# Patient Record
Sex: Female | Born: 2005 | Race: White | Hispanic: No | Marital: Single | Smoking: Never smoker
Health system: Southern US, Community
[De-identification: ages and names within clinical notes are randomized; demographics above are authoritative.]

## PROBLEM LIST (undated history)

## (undated) DIAGNOSIS — F32A Depression, unspecified: Secondary | ICD-10-CM

## (undated) DIAGNOSIS — F319 Bipolar disorder, unspecified: Secondary | ICD-10-CM

## (undated) DIAGNOSIS — F329 Major depressive disorder, single episode, unspecified: Secondary | ICD-10-CM

## (undated) DIAGNOSIS — F419 Anxiety disorder, unspecified: Secondary | ICD-10-CM

## (undated) HISTORY — PX: NO PAST SURGERIES: SHX2092

## (undated) HISTORY — PX: WISDOM TOOTH EXTRACTION: SHX21

---
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
## 2023-01-02 ENCOUNTER — Other Ambulatory Visit: Payer: Self-pay

## 2023-01-02 ENCOUNTER — Ambulatory Visit (INDEPENDENT_AMBULATORY_CARE_PROVIDER_SITE_OTHER): Payer: BC Managed Care – PPO | Admitting: *Deleted

## 2023-01-02 VITALS — BP 93/68 | HR 95 | Ht 63.0 in | Wt 138.5 lb

## 2023-01-02 DIAGNOSIS — Z3042 Encounter for surveillance of injectable contraceptive: Secondary | ICD-10-CM

## 2023-01-02 MED ORDER — MEDROXYPROGESTERONE ACETATE 150 MG/ML IM SUSP
150.0000 mg | Freq: Once | INTRAMUSCULAR | Status: AC
  Administered 2023-01-02: 150 mg via INTRAMUSCULAR

## 2023-01-02 NOTE — Progress Notes (Signed)
Depo Provera 150 mg IM administered as scheduled. Pt tolerated well. Next dose due 8/21-9/4. Per chart review, pt needs follow up appt w/Dr. Alvester Morin for discussion of hormone management. Pt will schedule @ check-out.

## 2023-01-15 ENCOUNTER — Ambulatory Visit: Payer: Self-pay | Admitting: Family Medicine

## 2023-03-20 ENCOUNTER — Ambulatory Visit: Payer: BC Managed Care – PPO

## 2023-04-05 ENCOUNTER — Ambulatory Visit: Payer: BC Managed Care – PPO

## 2023-04-08 ENCOUNTER — Ambulatory Visit: Payer: BC Managed Care – PPO

## 2023-04-19 ENCOUNTER — Other Ambulatory Visit: Payer: Self-pay

## 2023-04-19 ENCOUNTER — Ambulatory Visit (INDEPENDENT_AMBULATORY_CARE_PROVIDER_SITE_OTHER): Payer: BC Managed Care – PPO | Admitting: *Deleted

## 2023-04-19 VITALS — BP 106/73 | HR 91 | Ht 64.0 in | Wt 147.0 lb

## 2023-04-19 DIAGNOSIS — Z3042 Encounter for surveillance of injectable contraceptive: Secondary | ICD-10-CM

## 2023-04-19 LAB — POCT PREGNANCY, URINE: Preg Test, Ur: NEGATIVE

## 2023-04-19 MED ORDER — MEDROXYPROGESTERONE ACETATE 150 MG/ML IM SUSY
150.0000 mg | PREFILLED_SYRINGE | Freq: Once | INTRAMUSCULAR | Status: AC
  Administered 2023-04-19: 150 mg via INTRAMUSCULAR

## 2023-04-19 NOTE — Progress Notes (Signed)
Here for depo-provera injection. Last injection 01/02/23 which was 15weeks 2 days ago. Last provider exam 10/11/22.  Per protocol I asked when was last intercourse and she reports 1 week ago but used condoms. UPT today negative. Injection given without complaint. Sent to desk to schedule next injection. Nancy Fetter

## 2023-05-29 ENCOUNTER — Emergency Department (HOSPITAL_COMMUNITY): Payer: BC Managed Care – PPO

## 2023-05-29 ENCOUNTER — Other Ambulatory Visit: Payer: Self-pay

## 2023-05-29 ENCOUNTER — Emergency Department (HOSPITAL_COMMUNITY)
Admission: EM | Admit: 2023-05-29 | Discharge: 2023-05-30 | Disposition: A | Discharge status: Home / Self Care | Payer: BC Managed Care – PPO | Attending: Emergency Medicine | Admitting: Emergency Medicine

## 2023-05-29 ENCOUNTER — Encounter (HOSPITAL_COMMUNITY): Payer: Self-pay | Admitting: Emergency Medicine

## 2023-05-29 DIAGNOSIS — R195 Other fecal abnormalities: Secondary | ICD-10-CM | POA: Diagnosis not present

## 2023-05-29 DIAGNOSIS — R109 Unspecified abdominal pain: Secondary | ICD-10-CM | POA: Diagnosis not present

## 2023-05-29 DIAGNOSIS — R197 Diarrhea, unspecified: Secondary | ICD-10-CM | POA: Insufficient documentation

## 2023-05-29 LAB — CBC WITH DIFFERENTIAL/PLATELET
Abs Immature Granulocytes: 0.02 10*3/uL (ref 0.00–0.07)
Basophils Absolute: 0 10*3/uL (ref 0.0–0.1)
Basophils Relative: 1 %
Eosinophils Absolute: 0.1 10*3/uL (ref 0.0–0.5)
Eosinophils Relative: 1 %
HCT: 39.1 % (ref 36.0–46.0)
Hemoglobin: 12.8 g/dL (ref 12.0–15.0)
Immature Granulocytes: 0 %
Lymphocytes Relative: 35 %
Lymphs Abs: 2.6 10*3/uL (ref 0.7–4.0)
MCH: 29.2 pg (ref 26.0–34.0)
MCHC: 32.7 g/dL (ref 30.0–36.0)
MCV: 89.1 fL (ref 80.0–100.0)
Monocytes Absolute: 0.7 10*3/uL (ref 0.1–1.0)
Monocytes Relative: 10 %
Neutro Abs: 4 10*3/uL (ref 1.7–7.7)
Neutrophils Relative %: 53 %
Platelets: 408 10*3/uL — ABNORMAL HIGH (ref 150–400)
RBC: 4.39 MIL/uL (ref 3.87–5.11)
RDW: 12.2 % (ref 11.5–15.5)
WBC: 7.5 10*3/uL (ref 4.0–10.5)
nRBC: 0 % (ref 0.0–0.2)

## 2023-05-29 LAB — I-STAT CHEM 8, ED
BUN: 11 mg/dL (ref 6–20)
Calcium, Ion: 1.24 mmol/L (ref 1.15–1.40)
Chloride: 106 mmol/L (ref 98–111)
Creatinine, Ser: 0.6 mg/dL (ref 0.44–1.00)
Glucose, Bld: 94 mg/dL (ref 70–99)
HCT: 38 % (ref 36.0–46.0)
Hemoglobin: 12.9 g/dL (ref 12.0–15.0)
Potassium: 3.7 mmol/L (ref 3.5–5.1)
Sodium: 139 mmol/L (ref 135–145)
TCO2: 21 mmol/L — ABNORMAL LOW (ref 22–32)

## 2023-05-29 LAB — COMPREHENSIVE METABOLIC PANEL
ALT: 15 U/L (ref 0–44)
AST: 20 U/L (ref 15–41)
Albumin: 4.4 g/dL (ref 3.5–5.0)
Alkaline Phosphatase: 95 U/L (ref 38–126)
Anion gap: 9 (ref 5–15)
BUN: 12 mg/dL (ref 6–20)
CO2: 21 mmol/L — ABNORMAL LOW (ref 22–32)
Calcium: 9.4 mg/dL (ref 8.9–10.3)
Chloride: 107 mmol/L (ref 98–111)
Creatinine, Ser: 0.52 mg/dL (ref 0.44–1.00)
GFR, Estimated: >60 mL/min (ref >60)
Glucose, Bld: 94 mg/dL (ref 70–99)
Potassium: 3.6 mmol/L (ref 3.5–5.1)
Sodium: 137 mmol/L (ref 135–145)
Total Bilirubin: 0.6 mg/dL (ref 0.3–1.2)
Total Protein: 7.9 g/dL (ref 6.5–8.1)

## 2023-05-29 LAB — LIPASE, BLOOD: Lipase: 43 U/L (ref 11–51)

## 2023-05-29 LAB — HCG, QUANTITATIVE, PREGNANCY: hCG, Beta Chain, Quant, S: <1 m[IU]/mL (ref <5)

## 2023-05-29 MED ORDER — IOHEXOL 300 MG/ML  SOLN
100.0000 mL | Freq: Once | INTRAMUSCULAR | Status: AC | PRN
  Administered 2023-05-29: 100 mL via INTRAVENOUS

## 2023-05-29 NOTE — ED Triage Notes (Signed)
Patient arrives ambulatory by POV c/o pink stools x 2 days. Yesterday began vomiting blood. Today having right sided abdominal pain.

## 2023-05-29 NOTE — ED Provider Triage Note (Signed)
Emergency Medicine Provider Triage Evaluation Note  Emily Kirk , a 17 y.o. adult  was evaluated in triage.  Pt complains of hematochezia/hematemesis, abdominal pain.  Review of Systems  Positive:  Negative:   Physical Exam  BP 117/65 (BP Location: Left Arm)   Pulse 90   Temp 98.3 F (36.8 C) (Oral)   Resp (!) 161   Ht 5\' 4"  (1.626 m)   Wt 65.8 kg   SpO2 99%   BMI 24.89 kg/m  Gen:   Awake, no distress   Resp:  Normal effort  MSK:   Moves extremities without difficulty  Other:    Medical Decision Making  Medically screening exam initiated at 7:12 PM.  Appropriate orders placed.  Emily Kirk was informed that the remainder of the evaluation will be completed by another provider, this initial triage assessment does not replace that evaluation, and the importance of remaining in the ED until their evaluation is complete.  Pink stools/diarrhea x2 days. Hematemesis started yesterday. Intermittent RLQ abdominal pain since today. Denies ibuprofen/blood thinners. Denies abdominal trauma.   Denies fever, chest pain, dyspnea, cough, nausea, vomiting, diarrhea, dysuria, hematuria.    Emily Kirk, [place] 05/29/23 [phone]

## 2023-05-30 MED ORDER — DICYCLOMINE HCL 20 MG PO TABS: 20.0000 mg | ORAL_TABLET | Freq: Two times a day (BID) | ORAL | 0 refills | Status: AC

## 2023-05-30 MED ORDER — HYDROCORTISONE ACETATE 25 MG RE SUPP: 25.0000 mg | Freq: Two times a day (BID) | RECTAL | 0 refills | Status: AC

## 2023-05-30 MED ORDER — FAMOTIDINE 20 MG PO TABS: 20.0000 mg | ORAL_TABLET | Freq: Two times a day (BID) | ORAL | 0 refills | Status: AC

## 2023-05-30 NOTE — Discharge Instructions (Addendum)
Take the prescribed medication as directed. We discussed stool sample here, kit given.  Can call your doctor to have this done as an outpatient if you like. Follow-up with GI if symptoms persist-- call to make appt. Return to the ED for new or worsening symptoms.

## 2023-05-30 NOTE — ED Provider Notes (Signed)
[place] EMERGENCY DEPARTMENT AT [facility] Provider Note   CSN: [id number] Arrival date & time: 05/29/23  1824     History  Chief Complaint  Patient presents with   Rectal Bleeding   Hematemesis    Emily Kirk is a 17 y.o. adult.  The history is provided by the patient and medical records.  Rectal Bleeding  17 year old female with history of anxiety, bipolar disorder, depression, gender dysphoria, presenting to the ED for diarrhea and blood in stool.  Began having diarrhea yesterday, reports innumerable episodes.  Stool has been loose/watery, was pink-tinged yesterday but progressed to more blood today.  She is not having any bleeding aside from with bowel movements.  Also reports she feels like she vomited up a little blood today.  She has not had any fever or chills.  No sick contacts with similar.  No recent travel or abnormal food intake.  No recent antibiotics use.  No underlying history of IBS, Crohn's, ulcerative colitis, etc.  Mother does have history of IBS.  Home Medications Prior to Admission medications   Medication Sig Start Date End Date Taking? Authorizing Provider  dicyclomine (BENTYL) 20 MG tablet Take 1 tablet (20 mg total) by mouth 2 (two) times daily. 05/30/23  Yes Garlon Hatchet, PA-C  famotidine (PEPCID) 20 MG tablet Take 1 tablet (20 mg total) by mouth 2 (two) times daily. 05/30/23  Yes Garlon Hatchet, PA-C  hydrocortisone (ANUSOL-HC) 25 MG suppository Place 1 suppository (25 mg total) rectally 2 (two) times daily. 05/30/23  Yes Garlon Hatchet, PA-C  ALPRAZolam Prudy Feeler) 1 MG tablet Take 1 mg by mouth daily as needed. 10/12/20   [provider]  amphetamine-dextroamphetamine (ADDERALL XR) 10 MG 24 hr capsule Take 10 mg by mouth every morning. 04/02/23   [provider]  amphetamine-dextroamphetamine (ADDERALL) 5 MG tablet Take 1 tablet by mouth daily. 03/29/23   [provider]  busPIRone (BUSPAR) 10 MG tablet Take 10  mg by mouth 2 (two) times daily. 10/21/21   [provider]  Elagolix Sodium (ORILISSA) 150 MG TABS Take 1 tablet (150 mg total) by mouth daily. 12/21/22   Federico Flake, MD  FLUoxetine (PROZAC) 20 MG capsule Take 60 mg by mouth every morning. 01/19/23   [provider]  ibuprofen (ADVIL) 600 MG tablet Take 1 tablet (600 mg total) by mouth every 6 (six) hours as needed. 12/17/22   Immordino, Jeannett Senior, FNP  ipratropium (ATROVENT) 0.03 % nasal spray Place 2 sprays into both nostrils every 12 (twelve) hours. 09/17/22   Radford Pax, NP  lurasidone (LATUDA) 40 MG TABS tablet Take 40 mg by mouth daily with breakfast. Patient not taking: Reported on 04/19/2023    [provider]  QUEtiapine (SEROQUEL) 50 MG tablet Take 50 mg by mouth at bedtime.    [provider]  rizatriptan (MAXALT) 5 MG tablet Take 1 tablet along with Ibuprofen 400mg  at onset of migraine. May repeat in 2 hours if needed Patient not taking: Reported on 10/08/2022 05/04/21   Elveria Rising, NP  SYMBICORT 80-4.5 MCG/ACT inhaler Inhale 2 puffs into the lungs as needed. 10/22/22   [provider]  clonazePAM (KLONOPIN) 0.5 MG tablet TAKE 1/2 TABLET TWICE DAILY AS NEEDED FOR ANXIETY Patient not taking: No sig reported 09/22/18 09/29/20  Darcel Smalling, MD  norethindrone-ethinyl estradiol 1/35 (ORTHO-NOVUM) tablet Take by mouth. 05/06/17 11/09/20  [provider]      Allergies    Penicillins and Lurasidone  Review of Systems   Review of Systems  Gastrointestinal:  Positive for blood in stool and hematochezia.  All other systems reviewed and are negative.   Physical Exam Updated Vital Signs BP 120/69 (BP Location: Left Arm)   Pulse (!) 107   Temp 97.9 F (36.6 C) (Oral)   Resp 18   Ht 5\' 4"  (1.626 m)   Wt 65.8 kg   SpO2 99%   BMI 24.89 kg/m   Physical Exam Vitals and nursing note reviewed.  Constitutional:      Appearance: He is well-developed.  HENT:     Head:  Normocephalic and atraumatic.  Eyes:     Conjunctiva/sclera: Conjunctivae normal.     Pupils: Pupils are equal, round, and reactive to light.  Cardiovascular:     Rate and Rhythm: Normal rate and regular rhythm.     Heart sounds: Normal heart sounds.  Pulmonary:     Effort: Pulmonary effort is normal. No respiratory distress.     Breath sounds: Normal breath sounds. No rhonchi.  Abdominal:     General: Bowel sounds are normal.     Palpations: Abdomen is soft.     Tenderness: There is no abdominal tenderness. There is no guarding or rebound.  Musculoskeletal:        General: Normal range of motion.     Cervical back: Normal range of motion.  Skin:    General: Skin is warm and dry.  Neurological:     Mental Status: He is alert and oriented to person, place, and time.     ED Results / Procedures / Treatments   Labs (all labs ordered are listed, but only abnormal results are displayed) Labs Reviewed  COMPREHENSIVE METABOLIC PANEL - Abnormal; Notable for the following components:      Result Value   CO2 21 (*)    All other components within normal limits  CBC WITH DIFFERENTIAL/PLATELET - Abnormal; Notable for the following components:   Platelets 408 (*)    All other components within normal limits  I-STAT CHEM 8, ED - Abnormal; Notable for the following components:   TCO2 21 (*)    All other components within normal limits  LIPASE, BLOOD  HCG, QUANTITATIVE, PREGNANCY    EKG None  Radiology CT ABDOMEN PELVIS W CONTRAST  Result Date: 05/29/2023 CLINICAL DATA:  Right lower quadrant abdominal pain, blood in stool, hematemesis EXAM: CT ABDOMEN AND PELVIS WITH CONTRAST TECHNIQUE: Multidetector CT imaging of the abdomen and pelvis was performed using the standard protocol following bolus administration of intravenous contrast. RADIATION DOSE REDUCTION: This exam was performed according to the departmental dose-optimization program which includes automated exposure control,  adjustment of the mA and/or kV according to patient size and/or use of iterative reconstruction technique. CONTRAST:  OMNIPAQUE IOHEXOL 300 MG/ML  SOLN COMPARISON:  None Available. FINDINGS: Lower chest: No acute pleural or parenchymal lung disease. Hepatobiliary: No focal liver abnormality is seen. No gallstones, gallbladder wall thickening, or biliary dilatation. Pancreas: Unremarkable. No pancreatic ductal dilatation or surrounding inflammatory changes. Spleen: Normal in size without focal abnormality. Adrenals/Urinary Tract: Adrenal glands are unremarkable. Kidneys are normal, without renal calculi, focal lesion, or hydronephrosis. Bladder is unremarkable. Stomach/Bowel: No bowel obstruction or ileus. Normal appendix right lower quadrant. No bowel wall thickening or inflammatory change. Vascular/Lymphatic: No significant vascular findings are present. No enlarged abdominal or pelvic lymph nodes. Reproductive: Uterus and bilateral adnexa are unremarkable. Other: Trace pelvic free fluid likely physiologic. No free intraperitoneal gas. No abdominal wall  hernia. Musculoskeletal: No acute or destructive bony abnormalities. Reconstructed images demonstrate no additional findings. IMPRESSION: 1. Trace pelvic free fluid, likely physiologic. 2. Otherwise no acute intra-abdominal or intrapelvic process. Normal appendix. Electronically Signed   By: Sharlet Salina M.D.   On: 05/29/2023 22:01    Procedures Procedures    Medications Ordered in ED Medications  iohexol (OMNIPAQUE) 300 MG/ML solution 100 mL (100 mLs Intravenous Contrast Given 05/29/23 2044)    ED Course/ Medical Decision Making/ A&P                                 Medical Decision Making Amount and/or Complexity of Data Reviewed Labs: ordered. Radiology: ordered and independent interpretation performed. ECG/medicine tests: ordered and independent interpretation performed.  Risk Prescription drug management.   17 year old female  presenting to the ED with abdominal pain.  Began having diarrhea yesterday, initially loose and watery but now having some blood in stool.  She denies any passage of blood without bowel movement.  Also thinks she vomited blood today.  She is afebrile and nontoxic in appearance.  She is hemodynamically stable.  Her abdomen is soft and overall nontender.  She does admit to a lot of rectal irritation and soreness now.  Labs were obtained which are grossly reassuring--no leukocytosis, hemoglobin stable at 12.8.  No electrolyte derangement.  CT scan performed from triage and reviewed, no acute findings.  Question potential viral process.  She has not had any recent travel or abnormal food intake.  As she is having diarrhea I did offer stool testing, however does not want this done today.  She would prefer to have this done as an outpatient with PCP if symptoms persist (given stool kit for collection).  This seems reasonable.  Can also consider IBS, IBD, etc.  Mother does have history of this.  Will refer to GI as well, can call for appointment.  Symptomatic care for now encouraged, prescription sent to pharmacy.  School note given.  Can return here for new concerns.   Final Clinical Impression(s) / ED Diagnoses Final diagnoses:  Diarrhea, unspecified type    Rx / DC Orders ED Discharge Orders          Ordered    famotidine (PEPCID) 20 MG tablet  2 times daily        05/30/23 0222    hydrocortisone (ANUSOL-HC) 25 MG suppository  2 times daily        05/30/23 0222    dicyclomine (BENTYL) 20 MG tablet  2 times daily        05/30/23 0222              Garlon Hatchet, PA-C 05/30/23 0231    Nira Conn, MD 05/30/23 2318

## 2023-07-05 ENCOUNTER — Ambulatory Visit: Payer: BC Managed Care – PPO
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
